# Patient Record
Sex: Female | Born: 1995
Health system: Southern US, Community
[De-identification: ages and names within clinical notes are randomized; demographics above are authoritative.]

## PROBLEM LIST (undated history)

## (undated) ENCOUNTER — Emergency Department: Discharge status: Absent without leave | Payer: Self-pay

## (undated) DIAGNOSIS — F429 Obsessive-compulsive disorder, unspecified: Secondary | ICD-10-CM

## (undated) DIAGNOSIS — F32A Depression, unspecified: Secondary | ICD-10-CM

## (undated) DIAGNOSIS — F419 Anxiety disorder, unspecified: Secondary | ICD-10-CM

## (undated) DIAGNOSIS — F329 Major depressive disorder, single episode, unspecified: Secondary | ICD-10-CM

## (undated) HISTORY — DX: Obsessive-compulsive disorder, unspecified: F42.9

## (undated) HISTORY — PX: ANKLE FRACTURE SURGERY: SHX122

## (undated) HISTORY — DX: Anxiety disorder, unspecified: F41.9

## (undated) HISTORY — PX: NO PAST SURGERIES: SHX2092

## (undated) HISTORY — DX: Depression, unspecified: F32.A

---
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
## 2015-09-22 ENCOUNTER — Emergency Department
Admission: EM | Admit: 2015-09-22 | Discharge: 2015-09-22 | Disposition: A | Discharge status: Home / Self Care | Payer: Self-pay | Source: Home / Self Care | Attending: Family Medicine | Admitting: Family Medicine

## 2015-09-22 DIAGNOSIS — R52 Pain, unspecified: Secondary | ICD-10-CM

## 2015-09-22 DIAGNOSIS — J069 Acute upper respiratory infection, unspecified: Secondary | ICD-10-CM

## 2015-09-22 LAB — POCT INFLUENZA A/B
Influenza A, POC: NEGATIVE
Influenza B, POC: NEGATIVE

## 2015-09-22 MED ORDER — BENZONATATE 100 MG PO CAPS: 100.0000 mg | ORAL_CAPSULE | Freq: Three times a day (TID) | ORAL | Status: DC

## 2015-09-22 MED ORDER — AZITHROMYCIN 250 MG PO TABS: 250.0000 mg | ORAL_TABLET | Freq: Every day | ORAL | Status: DC

## 2015-09-22 NOTE — Discharge Instructions (Signed)
You may take 400-600mg Ibuprofen (Motrin) every 6-8 hours for fever and pain  °Alternate with Tylenol  °You may take 500mg Tylenol every 4-6 hours as needed for fever and pain  °Follow-up with your primary care provider next week for recheck of symptoms if not improving.  °Be sure to drink plenty of fluids and rest, at least 8hrs of sleep a night, preferably more while you are sick. °Return urgent care or go to closest ER if you cannot keep down fluids/signs of dehydration, fever not reducing with Tylenol, difficulty breathing/wheezing, stiff neck, worsening condition, or other concerns (see below)  °Please take antibiotics as prescribed and be sure to complete entire course even if you start to feel better to ensure infection does not come back. ° ° °Cool Mist Vaporizers °Vaporizers may help relieve the symptoms of a cough and cold. They add moisture to the air, which helps mucus to become thinner and less sticky. This makes it easier to breathe and cough up secretions. Cool mist vaporizers do not cause serious burns like hot mist vaporizers, which may also be called steamers or humidifiers. Vaporizers have not been proven to help with colds. You should not use a vaporizer if you are allergic to mold. °HOME CARE INSTRUCTIONS °· Follow the package instructions for the vaporizer. °· Do not use anything other than distilled water in the vaporizer. °· Do not run the vaporizer all of the time. This can cause mold or bacteria to grow in the vaporizer. °· Clean the vaporizer after each time it is used. °· Clean and dry the vaporizer well before storing it. °· Stop using the vaporizer if worsening respiratory symptoms develop. °  °This information is not intended to replace advice given to you by your health care provider. Make sure you discuss any questions you have with your health care provider. °  °Document Released: 04/09/2004 Document Revised: 07/18/2013 Document Reviewed: 11/30/2012 °Elsevier Interactive Patient  Education ©2016 [employer] ° °

## 2015-09-22 NOTE — ED Notes (Signed)
Had a cold about a week ago, yesterday started with nausea, vomited, and generalized body aches.

## 2015-09-22 NOTE — ED Provider Notes (Signed)
CSN: [id number]     Arrival date & time 09/22/15  1424 History   First MD Initiated Contact with Patient 09/22/15 1447     Chief Complaint  Patient presents with  . Generalized Body Aches   (Consider location/radiation/quality/duration/timing/severity/associated sxs/prior Treatment) HPI The pt is a 19yo female presenting to [facility] with c/o 1 day of flu-like symptoms with body aches, nausea, one episode of vomiting, that was preceded by about 1 week of URI symptoms with cough and congestion. She still has a mild to moderately intermittent productive cough.    History reviewed. No pertinent past medical history. History reviewed. No pertinent past surgical history. Family History  Problem Relation Age of Onset  . Family history unknown: Yes   Social History  Substance Use Topics  . Smoking status: Never Smoker   . Smokeless tobacco: Never Used  . Alcohol Use: No   OB History    No data available     Review of Systems  Constitutional: Positive for fever and chills.  HENT: Positive for congestion, rhinorrhea, sneezing and sore throat. Negative for ear pain, sinus pressure, trouble swallowing and voice change.   Respiratory: Positive for cough. Negative for shortness of breath.   Cardiovascular: Negative for chest pain and palpitations.  Gastrointestinal: Positive for nausea, vomiting and abdominal pain (generalized). Negative for diarrhea.  Musculoskeletal: Negative for myalgias, back pain and arthralgias.  Skin: Negative for rash.  Neurological: Positive for headaches. Negative for dizziness and light-headedness.    Allergies  Review of patient's allergies indicates no known allergies.  Home Medications   Prior to Admission medications   Medication Sig Start Date End Date Taking? Authorizing Provider  azithromycin (ZITHROMAX) 250 MG tablet Take 2 tabs PO x 1 dose, then 1 tab PO QD x 4 days 03/24/13   Floydene Flock, MD  azithromycin (ZITHROMAX) 250 MG tablet Take 1 tablet (250  mg total) by mouth daily. Take first 2 tablets together, then 1 every day until finished. 09/22/15   Junius Finner, PA-C  benzonatate (TESSALON) 100 MG capsule Take 1-2 capsules (100-200 mg total) by mouth every 8 (eight) hours. 09/22/15   Junius Finner, PA-C  norethindrone-ethinyl estradiol-iron (ESTROSTEP FE,TILIA FE,TRI-LEGEST FE) 1-20/1-30/1-35 MG-MCG tablet Take 1 tablet by mouth daily.    Historical Provider, MD   Meds Ordered and Administered this Visit  Medications - No data to display  BP 101/72 mmHg  Pulse 111  Temp(Src) 98.2 F (36.8 C) (Oral)  Ht  (1.753 m)  Wt 145 lb (65.772 kg)  BMI 21.40 kg/m2  SpO2 100%  LMP 09/22/2015 No data found.   Physical Exam  Constitutional: She appears well-developed and well-nourished. No distress.  HENT:  Head: Normocephalic and atraumatic.  Right Ear: Tympanic membrane normal.  Left Ear: Tympanic membrane normal.  Nose: Mucosal edema present. Right sinus exhibits maxillary sinus tenderness and frontal sinus tenderness. Left sinus exhibits maxillary sinus tenderness. Left sinus exhibits no frontal sinus tenderness.  Mouth/Throat: Uvula is midline and mucous membranes are normal. Posterior oropharyngeal erythema present. No oropharyngeal exudate, posterior oropharyngeal edema or tonsillar abscesses.  Eyes: Conjunctivae are normal. No scleral icterus.  Neck: Normal range of motion. Neck supple.  Cardiovascular: Normal rate, regular rhythm and normal heart sounds.   Pulmonary/Chest: Effort normal and breath sounds normal. No respiratory distress. She has no wheezes. She has no rales. She exhibits no tenderness.  Abdominal: Soft. She exhibits no distension. There is no tenderness.  Musculoskeletal: Normal range of motion.  Neurological: She  is alert.  Skin: Skin is warm and dry. She is not diaphoretic.  Nursing note and vitals reviewed.   ED Course  Procedures (including critical care time)  Labs Review Labs Reviewed  POCT  INFLUENZA A/B    Imaging Review No results found.    MDM   1. Acute upper respiratory infection   2. Body aches     Pt c/o flu-like symptoms for 1 day but over 1 week with cough and congestion. Will tx for atypical bacteria with Azithromycin   Rx: azithromycin and tessalon  Advised pt to use acetaminophen and ibuprofen as needed for fever and pain. Encouraged rest and fluids. F/u with PCP in 7-10 days if not improving, sooner if worsening. Pt verbalized understanding and agreement with tx plan.    Junius Finner, PA-C 09/22/15 1606

## 2018-03-01 ENCOUNTER — Encounter: Payer: Self-pay | Admitting: Certified Nurse Midwife

## 2018-03-01 ENCOUNTER — Ambulatory Visit: Payer: Self-pay | Admitting: Certified Nurse Midwife

## 2018-03-01 ENCOUNTER — Telehealth: Payer: Self-pay

## 2018-03-01 ENCOUNTER — Telehealth: Payer: Self-pay | Admitting: Certified Nurse Midwife

## 2018-03-01 VITALS — BP 109/41 | HR 84 | Ht 69.0 in | Wt 162.2 lb

## 2018-03-01 DIAGNOSIS — N941 Unspecified dyspareunia: Secondary | ICD-10-CM

## 2018-03-01 DIAGNOSIS — N760 Acute vaginitis: Secondary | ICD-10-CM

## 2018-03-01 DIAGNOSIS — N9489 Other specified conditions associated with female genital organs and menstrual cycle: Secondary | ICD-10-CM

## 2018-03-01 DIAGNOSIS — N949 Unspecified condition associated with female genital organs and menstrual cycle: Secondary | ICD-10-CM

## 2018-03-01 MED ORDER — METRONIDAZOLE 500 MG PO TABS: 500.0000 mg | ORAL_TABLET | Freq: Two times a day (BID) | ORAL | 0 refills | Status: AC

## 2018-03-01 NOTE — Addendum Note (Signed)
Addended by: Brooke DareSICK, Cheick Suhr L on: 03/01/2018 04:58 PM   Modules accepted: Orders

## 2018-03-01 NOTE — Progress Notes (Signed)
New pt is here with c/o vaginal discomfort -itching and burning. 2 new partners in a month.

## 2018-03-01 NOTE — Progress Notes (Signed)
GYN ENCOUNTER NOTE  Subjective:       Brianna Peterson is a 22 y.o. No obstetric history on file. female is here for gynecologic evaluation of the following issues, has vaginal burning and itching that started about a month ago, that "comes and goes",but admits to having these same symptoms a month ago. She has two new partners and denies using condoms and complains of painful sex She denies abnormal discharge and foul odor.. She had STI testing about a couple of months ago at the health department before the new partners and was negative for STI.  She has tried OTC diflucan with some relief but symptoms have returned  Gynecologic History No LMP recorded. Contraception: OCP (estrogen/progesterone) Last Pap: recently per patient.. Results were: normal Last mammogram: N/A  Obstetric History OB History  No data available    No past medical history on file.  No past surgical history on file.  Current Outpatient Medications on File Prior to Visit  Medication Sig Dispense Refill  . azithromycin (ZITHROMAX) 250 MG tablet Take 2 tabs PO x 1 dose, then 1 tab PO QD x 4 days 6 tablet 0  . azithromycin (ZITHROMAX) 250 MG tablet Take 1 tablet (250 mg total) by mouth daily. Take first 2 tablets together, then 1 every day until finished. 6 tablet 0  . benzonatate (TESSALON) 100 MG capsule Take 1-2 capsules (100-200 mg total) by mouth every 8 (eight) hours. 21 capsule 0  . norethindrone-ethinyl estradiol-iron (ESTROSTEP FE,TILIA FE,TRI-LEGEST FE) 1-20/1-30/1-35 MG-MCG tablet Take 1 tablet by mouth daily.     No current facility-administered medications on file prior to visit.     No Known Allergies  Social History   Socioeconomic History  . Marital status: Single    Spouse name: Not on file  . Number of children: Not on file  . Years of education: Not on file  . Highest education level: Not on file  Occupational History  . Not on file  Social Needs  . Financial resource strain: Not on file   . Food insecurity:    Worry: Not on file    Inability: Not on file  . Transportation needs:    Medical: Not on file    Non-medical: Not on file  Tobacco Use  . Smoking status: Never Smoker  . Smokeless tobacco: Never Used  Substance and Sexual Activity  . Alcohol use: No  . Drug use: No  . Sexual activity: Yes    Birth control/protection: Pill  Lifestyle  . Physical activity:    Days per week: Not on file    Minutes per session: Not on file  . Stress: Not on file  Relationships  . Social connections:    Talks on phone: Not on file    Gets together: Not on file    Attends religious service: Not on file    Active member of club or organization: Not on file    Attends meetings of clubs or organizations: Not on file    Relationship status: Not on file  . Intimate partner violence:    Fear of current or ex partner: Not on file    Emotionally abused: Not on file    Physically abused: Not on file    Forced sexual activity: Not on file  Other Topics Concern  . Not on file  Social History Narrative  . Not on file    Family History  Family history unknown: Yes    The following portions of the patient's  history were reviewed and updated as appropriate: allergies, current medications, past family history, past medical history, past social history, past surgical history and problem list.  Review of Systems Review of Systems - Negative except as mentioned in HPI Review of Systems - General ROS: negative for - chills, fatigue, fever, hot flashes, malaise or night sweats Hematological and Lymphatic ROS: negative for - bleeding problems or swollen lymph nodes Gastrointestinal ROS: negative for - abdominal pain, blood in stools, change in bowel habits and nausea/vomiting Musculoskeletal ROS: negative for - joint pain, muscle pain or muscular weakness Genito-Urinary ROS: negative for - change in menstrual cycle, dysmenorrhea,, dysuria, genital discharge, genital ulcers, hematuria,  incontinence, irregular/heavy menses, nocturia or pelvic pain Positive-dysparenia, vaginal itching and burning  Objective:   There were no vitals taken for this visit. CONSTITUTIONAL: Well-developed, well-nourished female in no acute distress.  HENT:  Normocephalic, atraumatic.  NECK: Normal range of motion, supple, no masses.  Normal thyroid.  SKIN: Skin is warm and dry. No rash noted. Not diaphoretic. No erythema. No pallor. NEUROLGIC: Alert and oriented to person, place, and time. PSYCHIATRIC: Normal mood and affect. Normal behavior. Normal judgment and thought content. CARDIOVASCULAR:Not Examined RESPIRATORY: Not Examined BREASTS: Not Examined ABDOMEN: Soft, non distended; nontender  No Organomegaly. PELVIC:  External Genitalia: Normal  BUS: Normal  Vagina: Normal-Vaginal wall tender to touch  Cervix: Normal, small amount of bleeding noted due to start of menses, no CVA tenderness  Uterus: Normal size, shape,consistency, mobile  Adnexa: Normal  RV: Normal   Bladder: Nontender MUSCULOSKELETAL: Normal range of motion. No tenderness.  No cyanosis, clubbing, or edema.     Assessment:  Vaginitis Dyspareunia- discussed use of lubrication. Pt states that sometimes she gets dry and experiences friction with thrusting.   Wet mount- + Clue cells,-Whiff test- Flagyl prescribed. Advised safe sex practices Patient has no insurance. Will call for cost on testing for GC/chlamydia & trich. Will call with pricing for approval from pt to send test. If not will have done a health department.    AShanika Creay,SNM / Doreene BurkeAnnie Alanta Scobey, CNM    Plan:

## 2018-03-01 NOTE — Telephone Encounter (Signed)
The patient called and stated that she is going to need a call back I regards to her not being able to reach anyone from [facility] from the number that was given to her and that she is unsure if someone will get the vm she left. Please advise.

## 2018-03-01 NOTE — Patient Instructions (Signed)

## 2018-03-01 NOTE — Telephone Encounter (Signed)
Pt has no insurance at this point. A call was placed to Amy at [facility] inquiring about price per pt request. Amy states it will be approx $270 but [facility] will work with pt to reduce amount.Amy requested I send the specimen to lab. Amy asked me to have pt call the financial assistance Program at [phone]. A call was placed to pt and she was given the above information. Phone number and test code [id number] was written down by pt and she stated she will call. I asked pt if it was ok to send specimen and she agreed. [facility] to [facility].

## 2018-03-01 NOTE — Addendum Note (Signed)
Addended by: Brooke DareSICK, Cleburn Maiolo L on: 03/01/2018 05:03 PM   Modules accepted: Orders

## 2018-03-02 ENCOUNTER — Other Ambulatory Visit (HOSPITAL_COMMUNITY)
Admission: RE | Admit: 2018-03-02 | Discharge: 2018-03-02 | Disposition: A | Discharge status: Home / Self Care | Payer: Self-pay | Source: Ambulatory Visit | Attending: Certified Nurse Midwife | Admitting: Certified Nurse Midwife

## 2018-03-02 DIAGNOSIS — N760 Acute vaginitis: Secondary | ICD-10-CM | POA: Insufficient documentation

## 2018-03-02 DIAGNOSIS — N941 Unspecified dyspareunia: Secondary | ICD-10-CM | POA: Insufficient documentation

## 2018-03-02 NOTE — Addendum Note (Signed)
Addended by: Brooke DareSICK, Estrellita Lasky L on: 03/02/2018 09:26 AM   Modules accepted: Orders

## 2018-03-02 NOTE — Addendum Note (Signed)
Addended by: Brooke DareSICK, Kalisa Girtman L on: 03/02/2018 08:57 AM   Modules accepted: Orders

## 2018-03-03 LAB — CERVICOVAGINAL ANCILLARY ONLY
Chlamydia: NEGATIVE
Neisseria Gonorrhea: NEGATIVE
TRICH (WINDOWPATH): NEGATIVE

## 2018-05-02 ENCOUNTER — Other Ambulatory Visit (HOSPITAL_COMMUNITY)
Admission: RE | Admit: 2018-05-02 | Discharge: 2018-05-02 | Disposition: A | Discharge status: Home / Self Care | Payer: 59 | Source: Ambulatory Visit | Attending: Certified Nurse Midwife | Admitting: Certified Nurse Midwife

## 2018-05-02 ENCOUNTER — Encounter: Payer: Self-pay | Admitting: Certified Nurse Midwife

## 2018-05-02 ENCOUNTER — Ambulatory Visit (INDEPENDENT_AMBULATORY_CARE_PROVIDER_SITE_OTHER): Payer: 59 | Admitting: Certified Nurse Midwife

## 2018-05-02 VITALS — BP 98/57 | HR 73 | Ht 69.0 in | Wt 162.9 lb

## 2018-05-02 DIAGNOSIS — N949 Unspecified condition associated with female genital organs and menstrual cycle: Secondary | ICD-10-CM | POA: Diagnosis not present

## 2018-05-02 MED ORDER — FLUCONAZOLE 150 MG PO TABS: 150.0000 mg | ORAL_TABLET | ORAL | 0 refills | Status: AC

## 2018-05-02 NOTE — Progress Notes (Signed)
GYN ENCOUNTER NOTE  Subjective:       Brianna Peterson is a 22 y.o. G0P0000 female is here for gynecologic evaluation of the following issues:  1. Vaginal burning, she has had for the past few months. She was here in August and treated for BV she states that the discharge and burning improved some but never really completely went away. She denies any new partners and has not been sexually active in a while.      Gynecologic History Patient's last menstrual period was 04/20/2018 (exact date). Contraception: OCP (estrogen/progesterone) Last Pap: this year. Results were: normal per pt.  Last mammogram: n/a.   Obstetric History OB History  Gravida Para Term Preterm AB Living  0 0 0 0 0 0  SAB TAB Ectopic Multiple Live Births  0 0 0 0 0    History reviewed. No pertinent past medical history.  History reviewed. No pertinent surgical history.  Current Outpatient Medications on File Prior to Visit  Medication Sig Dispense Refill  . TRI-SPRINTEC 0.18/0.215/0.25 MG-35 MCG tablet Take 1 tablet by mouth daily.  3   No current facility-administered medications on file prior to visit.     No Known Allergies  Social History   Socioeconomic History  . Marital status: Single    Spouse name: Not on file  . Number of children: Not on file  . Years of education: Not on file  . Highest education level: Not on file  Occupational History  . Not on file  Social Needs  . Financial resource strain: Not on file  . Food insecurity:    Worry: Not on file    Inability: Not on file  . Transportation needs:    Medical: Not on file    Non-medical: Not on file  Tobacco Use  . Smoking status: Never Smoker  . Smokeless tobacco: Never Used  Substance and Sexual Activity  . Alcohol use: No  . Drug use: No  . Sexual activity: Yes    Birth control/protection: Pill  Lifestyle  . Physical activity:    Days per week: Not on file    Minutes per session: Not on file  . Stress: Not on file   Relationships  . Social connections:    Talks on phone: Not on file    Gets together: Not on file    Attends religious service: Not on file    Active member of club or organization: Not on file    Attends meetings of clubs or organizations: Not on file    Relationship status: Not on file  . Intimate partner violence:    Fear of current or ex partner: Not on file    Emotionally abused: Not on file    Physically abused: Not on file    Forced sexual activity: Not on file  Other Topics Concern  . Not on file  Social History Narrative  . Not on file    Family History  Problem Relation Age of Onset  . Rheum arthritis Mother     The following portions of the patient's history were reviewed and updated as appropriate: allergies, current medications, past family history, past medical history, past social history, past surgical history and problem list.  Review of Systems Review of Systems - Negative except as mentioned in HPI Review of Systems - General ROS: negative for - chills, fatigue, fever, hot flashes, malaise or night sweats Hematological and Lymphatic ROS: negative for - bleeding problems or swollen lymph nodes Gastrointestinal ROS: negative  for - abdominal pain, blood in stools, change in bowel habits and nausea/vomiting Musculoskeletal ROS: negative for - joint pain, muscle pain or muscular weakness Genito-Urinary ROS: negative for - change in menstrual cycle, dysmenorrhea, dyspareunia, dysuria, genital discharge, genital ulcers, hematuria, incontinence, irregular/heavy menses, nocturia or pelvic pain. Positive for vaginal burning.   Objective:   BP (!) 98/57   Pulse 73   Ht 5\' 9"  (1.753 m)   Wt 162 lb 14.4 oz (73.9 kg)   LMP 04/20/2018 (Exact Date)   BMI 24.06 kg/m  CONSTITUTIONAL: Well-developed, well-nourished female in no acute distress.  HENT:  Normocephalic, atraumatic.  NECK: Normal range of motion, supple, no masses.  Normal thyroid.  SKIN: Skin is warm and  dry. No rash noted. Not diaphoretic. No erythema. No pallor. NEUROLGIC: Alert and oriented to person, place, and time. PSYCHIATRIC: Normal mood and affect. Normal behavior. Normal judgment and thought content. CARDIOVASCULAR:Not Examined RESPIRATORY: Not Examined BREASTS: Not Examined ABDOMEN: Soft, non distended; Non tender.  No Organomegaly. PELVIC:  External Genitalia: Normal  BUS: Normal  Vagina: mild redness noted, burning discomfort at the introitus and on vaginal walls with opening of speculum  Cervix: Normal, ectropion present. Small bleeding with contact from seculum. Negative CMT.   Uterus: Normal size, shape,consistency, mobile  Adnexa: Normal MUSCULOSKELETAL: Normal range of motion. No tenderness.  No cyanosis, clubbing, or edema.   Assessment:   Vaginitis   Plan:  Yeast and BV panel completed today. Discussed use of Rephresh. Diflucan ordered 150 mg every 3 days x  3 doses then 1 a week for 3 months. Will follow up with results fo swab. Pt agrees to plan.   Doreene Burke, CNM  Doreene Burke, CNM

## 2018-05-02 NOTE — Patient Instructions (Signed)

## 2018-05-04 LAB — CERVICOVAGINAL ANCILLARY ONLY
BACTERIAL VAGINITIS: NEGATIVE
CANDIDA VAGINITIS: NEGATIVE

## 2018-06-21 DIAGNOSIS — F411 Generalized anxiety disorder: Secondary | ICD-10-CM | POA: Diagnosis not present

## 2018-07-05 DIAGNOSIS — F411 Generalized anxiety disorder: Secondary | ICD-10-CM | POA: Diagnosis not present

## 2018-07-15 DIAGNOSIS — F411 Generalized anxiety disorder: Secondary | ICD-10-CM | POA: Diagnosis not present

## 2018-09-12 DIAGNOSIS — M9906 Segmental and somatic dysfunction of lower extremity: Secondary | ICD-10-CM | POA: Diagnosis not present

## 2018-09-12 DIAGNOSIS — M9903 Segmental and somatic dysfunction of lumbar region: Secondary | ICD-10-CM | POA: Diagnosis not present

## 2018-09-12 DIAGNOSIS — M545 Low back pain: Secondary | ICD-10-CM | POA: Diagnosis not present

## 2018-09-12 DIAGNOSIS — M9904 Segmental and somatic dysfunction of sacral region: Secondary | ICD-10-CM | POA: Diagnosis not present

## 2018-09-12 DIAGNOSIS — M9902 Segmental and somatic dysfunction of thoracic region: Secondary | ICD-10-CM | POA: Diagnosis not present

## 2018-09-12 DIAGNOSIS — M9907 Segmental and somatic dysfunction of upper extremity: Secondary | ICD-10-CM | POA: Diagnosis not present

## 2018-09-12 DIAGNOSIS — M4125 Other idiopathic scoliosis, thoracolumbar region: Secondary | ICD-10-CM | POA: Diagnosis not present

## 2018-09-14 ENCOUNTER — Other Ambulatory Visit: Payer: Self-pay | Admitting: General Practice

## 2018-09-14 ENCOUNTER — Ambulatory Visit
Admission: RE | Admit: 2018-09-14 | Discharge: 2018-09-14 | Disposition: A | Discharge status: Home / Self Care | Payer: 59 | Source: Ambulatory Visit | Attending: General Practice | Admitting: General Practice

## 2018-09-14 DIAGNOSIS — M4125 Other idiopathic scoliosis, thoracolumbar region: Secondary | ICD-10-CM | POA: Insufficient documentation

## 2018-09-14 DIAGNOSIS — M545 Low back pain, unspecified: Secondary | ICD-10-CM

## 2018-09-14 DIAGNOSIS — M4186 Other forms of scoliosis, lumbar region: Secondary | ICD-10-CM | POA: Diagnosis not present

## 2018-09-14 DIAGNOSIS — M546 Pain in thoracic spine: Secondary | ICD-10-CM | POA: Diagnosis not present

## 2018-09-22 DIAGNOSIS — M9902 Segmental and somatic dysfunction of thoracic region: Secondary | ICD-10-CM | POA: Diagnosis not present

## 2018-09-22 DIAGNOSIS — M4125 Other idiopathic scoliosis, thoracolumbar region: Secondary | ICD-10-CM | POA: Diagnosis not present

## 2018-09-22 DIAGNOSIS — M9904 Segmental and somatic dysfunction of sacral region: Secondary | ICD-10-CM | POA: Diagnosis not present

## 2018-09-22 DIAGNOSIS — M9906 Segmental and somatic dysfunction of lower extremity: Secondary | ICD-10-CM | POA: Diagnosis not present

## 2018-09-22 DIAGNOSIS — M545 Low back pain: Secondary | ICD-10-CM | POA: Diagnosis not present

## 2018-09-22 DIAGNOSIS — M9903 Segmental and somatic dysfunction of lumbar region: Secondary | ICD-10-CM | POA: Diagnosis not present

## 2018-09-22 DIAGNOSIS — M9907 Segmental and somatic dysfunction of upper extremity: Secondary | ICD-10-CM | POA: Diagnosis not present

## 2018-09-28 DIAGNOSIS — M9904 Segmental and somatic dysfunction of sacral region: Secondary | ICD-10-CM | POA: Diagnosis not present

## 2018-09-28 DIAGNOSIS — M9906 Segmental and somatic dysfunction of lower extremity: Secondary | ICD-10-CM | POA: Diagnosis not present

## 2018-09-28 DIAGNOSIS — M4125 Other idiopathic scoliosis, thoracolumbar region: Secondary | ICD-10-CM | POA: Diagnosis not present

## 2018-09-28 DIAGNOSIS — M9903 Segmental and somatic dysfunction of lumbar region: Secondary | ICD-10-CM | POA: Diagnosis not present

## 2018-09-28 DIAGNOSIS — M9902 Segmental and somatic dysfunction of thoracic region: Secondary | ICD-10-CM | POA: Diagnosis not present

## 2018-09-28 DIAGNOSIS — M9907 Segmental and somatic dysfunction of upper extremity: Secondary | ICD-10-CM | POA: Diagnosis not present

## 2018-09-28 DIAGNOSIS — M545 Low back pain: Secondary | ICD-10-CM | POA: Diagnosis not present

## 2018-10-10 DIAGNOSIS — F419 Anxiety disorder, unspecified: Secondary | ICD-10-CM | POA: Diagnosis not present

## 2019-01-25 ENCOUNTER — Telehealth: Payer: 59 | Admitting: Nurse Practitioner

## 2019-01-25 DIAGNOSIS — B373 Candidiasis of vulva and vagina: Secondary | ICD-10-CM | POA: Diagnosis not present

## 2019-01-25 DIAGNOSIS — B3731 Acute candidiasis of vulva and vagina: Secondary | ICD-10-CM

## 2019-01-25 MED ORDER — FLUCONAZOLE 150 MG PO TABS: 150.0000 mg | ORAL_TABLET | Freq: Once | ORAL | 0 refills | Status: AC

## 2019-01-25 NOTE — Progress Notes (Signed)

## 2019-03-10 DIAGNOSIS — F3289 Other specified depressive episodes: Secondary | ICD-10-CM | POA: Diagnosis not present

## 2019-03-10 DIAGNOSIS — F411 Generalized anxiety disorder: Secondary | ICD-10-CM | POA: Diagnosis not present

## 2019-03-17 DIAGNOSIS — F3289 Other specified depressive episodes: Secondary | ICD-10-CM | POA: Diagnosis not present

## 2019-03-17 DIAGNOSIS — F411 Generalized anxiety disorder: Secondary | ICD-10-CM | POA: Diagnosis not present

## 2019-03-21 DIAGNOSIS — S86811A Strain of other muscle(s) and tendon(s) at lower leg level, right leg, initial encounter: Secondary | ICD-10-CM | POA: Diagnosis not present

## 2019-03-24 DIAGNOSIS — F411 Generalized anxiety disorder: Secondary | ICD-10-CM | POA: Diagnosis not present

## 2019-03-24 DIAGNOSIS — F3289 Other specified depressive episodes: Secondary | ICD-10-CM | POA: Diagnosis not present

## 2019-03-27 DIAGNOSIS — F419 Anxiety disorder, unspecified: Secondary | ICD-10-CM | POA: Diagnosis not present

## 2019-03-30 DIAGNOSIS — S93491S Sprain of other ligament of right ankle, sequela: Secondary | ICD-10-CM | POA: Diagnosis not present

## 2019-03-30 DIAGNOSIS — S92141S Displaced dome fracture of right talus, sequela: Secondary | ICD-10-CM | POA: Diagnosis not present

## 2019-03-30 DIAGNOSIS — Z01818 Encounter for other preprocedural examination: Secondary | ICD-10-CM | POA: Diagnosis not present

## 2019-03-30 DIAGNOSIS — M93271 Osteochondritis dissecans, right ankle and joints of right foot: Secondary | ICD-10-CM | POA: Diagnosis not present

## 2019-03-30 DIAGNOSIS — S92141D Displaced dome fracture of right talus, subsequent encounter for fracture with routine healing: Secondary | ICD-10-CM | POA: Diagnosis not present

## 2019-03-30 DIAGNOSIS — S93491D Sprain of other ligament of right ankle, subsequent encounter: Secondary | ICD-10-CM | POA: Diagnosis not present

## 2019-04-05 DIAGNOSIS — F411 Generalized anxiety disorder: Secondary | ICD-10-CM | POA: Diagnosis not present

## 2019-04-05 DIAGNOSIS — F3289 Other specified depressive episodes: Secondary | ICD-10-CM | POA: Diagnosis not present

## 2019-04-10 DIAGNOSIS — Z01812 Encounter for preprocedural laboratory examination: Secondary | ICD-10-CM | POA: Diagnosis not present

## 2019-04-10 DIAGNOSIS — Z20828 Contact with and (suspected) exposure to other viral communicable diseases: Secondary | ICD-10-CM | POA: Diagnosis not present

## 2019-04-12 DIAGNOSIS — M24071 Loose body in right ankle: Secondary | ICD-10-CM | POA: Diagnosis not present

## 2019-04-12 DIAGNOSIS — S93491A Sprain of other ligament of right ankle, initial encounter: Secondary | ICD-10-CM | POA: Diagnosis not present

## 2019-04-12 DIAGNOSIS — Z79899 Other long term (current) drug therapy: Secondary | ICD-10-CM | POA: Diagnosis not present

## 2019-04-12 DIAGNOSIS — S92141A Displaced dome fracture of right talus, initial encounter for closed fracture: Secondary | ICD-10-CM | POA: Diagnosis not present

## 2019-04-12 DIAGNOSIS — M25371 Other instability, right ankle: Secondary | ICD-10-CM | POA: Diagnosis not present

## 2019-04-12 DIAGNOSIS — G8918 Other acute postprocedural pain: Secondary | ICD-10-CM | POA: Diagnosis not present

## 2019-04-12 DIAGNOSIS — S93431A Sprain of tibiofibular ligament of right ankle, initial encounter: Secondary | ICD-10-CM | POA: Diagnosis not present

## 2019-04-12 DIAGNOSIS — T753XXA Motion sickness, initial encounter: Secondary | ICD-10-CM | POA: Diagnosis not present

## 2019-04-12 DIAGNOSIS — M93271 Osteochondritis dissecans, right ankle and joints of right foot: Secondary | ICD-10-CM | POA: Diagnosis not present

## 2019-04-12 DIAGNOSIS — Z791 Long term (current) use of non-steroidal anti-inflammatories (NSAID): Secondary | ICD-10-CM | POA: Diagnosis not present

## 2019-04-19 DIAGNOSIS — F411 Generalized anxiety disorder: Secondary | ICD-10-CM | POA: Diagnosis not present

## 2019-04-19 DIAGNOSIS — F3289 Other specified depressive episodes: Secondary | ICD-10-CM | POA: Diagnosis not present

## 2019-04-27 DIAGNOSIS — S92141D Displaced dome fracture of right talus, subsequent encounter for fracture with routine healing: Secondary | ICD-10-CM | POA: Diagnosis not present

## 2019-05-08 DIAGNOSIS — S92141D Displaced dome fracture of right talus, subsequent encounter for fracture with routine healing: Secondary | ICD-10-CM | POA: Diagnosis not present

## 2019-05-22 DIAGNOSIS — S92141D Displaced dome fracture of right talus, subsequent encounter for fracture with routine healing: Secondary | ICD-10-CM | POA: Diagnosis not present

## 2019-06-05 DIAGNOSIS — S92141D Displaced dome fracture of right talus, subsequent encounter for fracture with routine healing: Secondary | ICD-10-CM | POA: Diagnosis not present

## 2019-06-21 DIAGNOSIS — S92141D Displaced dome fracture of right talus, subsequent encounter for fracture with routine healing: Secondary | ICD-10-CM | POA: Diagnosis not present

## 2019-06-30 DIAGNOSIS — S92141D Displaced dome fracture of right talus, subsequent encounter for fracture with routine healing: Secondary | ICD-10-CM | POA: Diagnosis not present

## 2019-07-10 DIAGNOSIS — S93491D Sprain of other ligament of right ankle, subsequent encounter: Secondary | ICD-10-CM | POA: Diagnosis not present

## 2019-07-10 DIAGNOSIS — S93491S Sprain of other ligament of right ankle, sequela: Secondary | ICD-10-CM | POA: Diagnosis not present

## 2019-07-11 DIAGNOSIS — S92141D Displaced dome fracture of right talus, subsequent encounter for fracture with routine healing: Secondary | ICD-10-CM | POA: Diagnosis not present

## 2019-07-15 DIAGNOSIS — Z20828 Contact with and (suspected) exposure to other viral communicable diseases: Secondary | ICD-10-CM | POA: Diagnosis not present

## 2019-07-18 DIAGNOSIS — S92141D Displaced dome fracture of right talus, subsequent encounter for fracture with routine healing: Secondary | ICD-10-CM | POA: Diagnosis not present

## 2019-08-16 ENCOUNTER — Other Ambulatory Visit: Payer: Self-pay

## 2019-08-16 ENCOUNTER — Other Ambulatory Visit: Payer: Self-pay | Admitting: Medical-Surgical

## 2019-08-16 ENCOUNTER — Encounter: Payer: Self-pay | Admitting: Medical-Surgical

## 2019-08-16 ENCOUNTER — Ambulatory Visit (INDEPENDENT_AMBULATORY_CARE_PROVIDER_SITE_OTHER): Payer: No Typology Code available for payment source | Admitting: Medical-Surgical

## 2019-08-16 ENCOUNTER — Other Ambulatory Visit (HOSPITAL_COMMUNITY)
Admission: RE | Admit: 2019-08-16 | Discharge: 2019-08-16 | Disposition: A | Discharge status: Home / Self Care | Payer: No Typology Code available for payment source | Source: Ambulatory Visit | Attending: Medical-Surgical | Admitting: Medical-Surgical

## 2019-08-16 VITALS — BP 92/58 | HR 77 | Temp 98.1°F | Ht 68.0 in | Wt 149.0 lb

## 2019-08-16 DIAGNOSIS — F419 Anxiety disorder, unspecified: Secondary | ICD-10-CM | POA: Diagnosis not present

## 2019-08-16 DIAGNOSIS — Z113 Encounter for screening for infections with a predominantly sexual mode of transmission: Secondary | ICD-10-CM

## 2019-08-16 DIAGNOSIS — Z124 Encounter for screening for malignant neoplasm of cervix: Secondary | ICD-10-CM | POA: Insufficient documentation

## 2019-08-16 DIAGNOSIS — R6889 Other general symptoms and signs: Secondary | ICD-10-CM | POA: Diagnosis not present

## 2019-08-16 DIAGNOSIS — F429 Obsessive-compulsive disorder, unspecified: Secondary | ICD-10-CM

## 2019-08-16 DIAGNOSIS — Z7689 Persons encountering health services in other specified circumstances: Secondary | ICD-10-CM

## 2019-08-16 DIAGNOSIS — F329 Major depressive disorder, single episode, unspecified: Secondary | ICD-10-CM

## 2019-08-16 DIAGNOSIS — F32A Depression, unspecified: Secondary | ICD-10-CM

## 2019-08-16 DIAGNOSIS — B9689 Other specified bacterial agents as the cause of diseases classified elsewhere: Secondary | ICD-10-CM

## 2019-08-16 LAB — WET PREP FOR TRICH, YEAST, CLUE
MICRO NUMBER:: 10061295
Specimen Quality: ADEQUATE

## 2019-08-16 NOTE — Progress Notes (Signed)
New Patient Office Visit  Subjective:  Patient ID: Brianna Peterson, female    DOB: Jun 18, 1996  Age: 24 y.o. MRN: [id number]  CC:  Chief Complaint  Patient presents with  . Establish Care  . Gynecologic Exam    HPI Brianna Peterson is a 24 year old female presenting today to establish care.  She would like to have a Pap smear done today along with STI screening.  Last Pap smear age 72, normal.  Currently has 2+ female sexual partners, denies condom use.  Birth control-pullout method and calendar tracking.  Last STI testing in 2019.  Anxiety/depression/OCD: On Wellbutrin 150 mg twice daily.  Medication effective, wants to continue current regimen.  Reports increased hair loss and cold intolerance with periods of excessive sweating and hot flashes.  Rarely eats red meat and is concerned that she may be anemic or have some hormonal issues since stopping birth control.  Would like to have blood work drawn today.   Past Medical History:  Diagnosis Date  . Anxiety   . Depression   . Obsessive compulsive disorder     Past Surgical History:  Procedure Laterality Date  . ANKLE FRACTURE SURGERY Right   . NO PAST SURGERIES      Family History  Problem Relation Age of Onset  . Rheum arthritis Mother     Social History   Socioeconomic History  . Marital status: Single    Spouse name: Not on file  . Number of children: Not on file  . Years of education: Not on file  . Highest education level: Not on file  Occupational History  . Occupation: Ultras[employer]    Employer: [employer]  Tobacco Use  . Smoking status: Never Smoker  . Smokeless tobacco: Never Used  Substance and Sexual Activity  . Alcohol use: Yes    Comment: 5 drinks/week  . Drug use: No  . Sexual activity: Yes    Partners: Male    Birth control/protection: None  Other Topics Concern  . Not on file  Social History Narrative  . Not on file   Social Determinants of Health   Financial Resource Strain:   .  Difficulty of Paying Living Expenses: Not on file  Food Insecurity:   . Worried About Programme researcher, broadcasting/film/video in the Last Year: Not on file  . Ran Out of Food in the Last Year: Not on file  Transportation Needs:   . Lack of Transportation (Medical): Not on file  . Lack of Transportation (Non-Medical): Not on file  Physical Activity:   . Days of Exercise per Week: Not on file  . Minutes of Exercise per Session: Not on file  Stress:   . Feeling of Stress : Not on file  Social Connections:   . Frequency of Communication with Friends and Family: Not on file  . Frequency of Social Gatherings with Friends and Family: Not on file  . Attends Religious Services: Not on file  . Active Member of Clubs or Organizations: Not on file  . Attends Banker Meetings: Not on file  . Marital Status: Not on file  Intimate Partner Violence:   . Fear of Current or Ex-Partner: Not on file  . Emotionally Abused: Not on file  . Physically Abused: Not on file  . Sexually Abused: Not on file    ROS Review of Systems  Constitutional: Negative for chills and fever.  Respiratory: Negative for chest tightness and shortness of breath.   Cardiovascular: Negative for chest pain  and palpitations.  Endocrine: Positive for cold intolerance.  Genitourinary: Negative for dysuria, menstrual problem, pelvic pain, vaginal bleeding, vaginal discharge and vaginal pain.   Depression screen [facility] 2/9 08/16/2019  Decreased Interest 0  Down, Depressed, Hopeless 1  PHQ - 2 Score 1  Altered sleeping 3  Tired, decreased energy 2  Change in appetite 2  Feeling bad or failure about yourself  1  Trouble concentrating 0  Moving slowly or fidgety/restless 1  Suicidal thoughts 0  PHQ-9 Score 10  Difficult doing work/chores Not difficult at all   GAD 7 : Generalized Anxiety Score 08/16/2019  Nervous, Anxious, on Edge 3  Control/stop worrying 1  Worry too much - different things 1  Trouble relaxing 2  Restless 0   Easily annoyed or irritable 3  Afraid - awful might happen 2  Total GAD 7 Score 12  Anxiety Difficulty Not difficult at all     Objective:   Today's Vitals: BP (!) 92/58   Pulse 77   Temp 98.1 F (36.7 C) (Oral)   Ht 5\' 8"  (1.727 m)   Wt 149 lb 0.6 oz (67.6 kg)   LMP 08/06/2019   SpO2 100%   BMI 22.66 kg/m   Physical Exam Vitals reviewed. Exam conducted with a chaperone present.  Constitutional:      Appearance: Normal appearance.  HENT:     Head: Normocephalic and atraumatic.  Cardiovascular:     Rate and Rhythm: Normal rate and regular rhythm.     Pulses: Normal pulses.     Heart sounds: Normal heart sounds. No murmur. No friction rub. No gallop.   Pulmonary:     Effort: Pulmonary effort is normal. No respiratory distress.     Breath sounds: Normal breath sounds.  Genitourinary:    General: Normal vulva.     Exam position: Lithotomy position.     Pubic Area: No rash.      Labia:        Right: No rash or lesion.        Left: No rash or lesion.      Vagina: Normal.     Cervix: Discharge (Thick, yellow) and friability present.     Rectum: Normal.  Musculoskeletal:        General: Normal range of motion.  Skin:    General: Skin is warm and dry.  Neurological:     Mental Status: She is alert and oriented to person, place, and time.     Assessment & Plan:   Problem List Items Addressed This Visit      Other   Screening examination for sexually transmitted disease - Primary    Checking for gonorrhea, chlamydia, RPR, HIV, hepatitis B/C, and trichomonas.  Discussed HSV-2 testing recommendations in asymptomatic individuals.  Will defer this at this time.      Relevant Orders   Cytology - PAP   WET PREP FOR TRICH, YEAST, CLUE   Hepatitis B surface antigen   RPR   Hepatitis C antibody, reflex   Hepatitis B surface antibody,qualitative   Hepatitis B core antibody, total   Hepatitis C antibody   Cold intolerance    Checking CBC, CMP, and TSH.       Relevant Orders   CBC   COMPLETE METABOLIC PANEL WITH GFR   TSH   Screening for cervical cancer    Pap smear without HPV testing done today.      Relevant Orders   Cytology - PAP   Encounter to  establish care    Discussed birth control options.  Not interested in hormonal birth control at this time.  Not comfortable with the idea of IUDs or implants.  Emphasized the importance of using condoms to prevent pregnancy and STIs.      Anxiety and depression    Well managed with Wellbutrin.  Continue twice daily dosing.      Relevant Medications   buPROPion (WELLBUTRIN SR) 150 MG 12 hr tablet   Obsessive-compulsive disorder    Well managed by Wellbutrin twice daily.  Continue regimen.      Relevant Medications   buPROPion (WELLBUTRIN SR) 150 MG 12 hr tablet      Outpatient Encounter Medications as of 08/16/2019  Medication Sig  . buPROPion (WELLBUTRIN SR) 150 MG 12 hr tablet Take 150 mg by mouth 2 (two) times daily.  . norethindrone-ethinyl estradiol (NECON,BREVICON,MODICON) 0.5-35 MG-MCG tablet Take 1 tablet by mouth daily.   No facility-administered encounter medications on file as of 08/16/2019.    Follow-up: Return in about 1 year (around 08/15/2020) for anxiety, depression follow up or sooner if needed.   Samuel Bouche, NP

## 2019-08-16 NOTE — Assessment & Plan Note (Signed)
Well managed by Wellbutrin twice daily.  Continue regimen.

## 2019-08-16 NOTE — Assessment & Plan Note (Signed)
Well managed with Wellbutrin.  Continue twice daily dosing.

## 2019-08-16 NOTE — Assessment & Plan Note (Signed)
Checking for gonorrhea, chlamydia, RPR, HIV, hepatitis B/C, and trichomonas.  Discussed HSV-2 testing recommendations in asymptomatic individuals.  Will defer this at this time.

## 2019-08-16 NOTE — Assessment & Plan Note (Signed)
Checking CBC, CMP, and TSH.

## 2019-08-16 NOTE — Assessment & Plan Note (Signed)
Discussed birth control options.  Not interested in hormonal birth control at this time.  Not comfortable with the idea of IUDs or implants.  Emphasized the importance of using condoms to prevent pregnancy and STIs.

## 2019-08-16 NOTE — Assessment & Plan Note (Signed)
Pap smear without HPV testing done today.

## 2019-08-17 LAB — CBC
HCT: 41.9 % (ref 35.0–45.0)
Hemoglobin: 13.6 g/dL (ref 11.7–15.5)
MCH: 29.2 pg (ref 27.0–33.0)
MCHC: 32.5 g/dL (ref 32.0–36.0)
MCV: 90.1 fL (ref 80.0–100.0)
MPV: 9.9 fL (ref 7.5–12.5)
Platelets: 266 10*3/uL (ref 140–400)
RBC: 4.65 10*6/uL (ref 3.80–5.10)
RDW: 12.1 % (ref 11.0–15.0)
WBC: 6.2 10*3/uL (ref 3.8–10.8)

## 2019-08-17 LAB — HEPATITIS C ANTIBODY
Hepatitis C Ab: NONREACTIVE
SIGNAL TO CUT-OFF: 0.01 (ref <1.00)

## 2019-08-17 LAB — CYTOLOGY - PAP
Chlamydia: NEGATIVE
Comment: NEGATIVE
Comment: NORMAL
Diagnosis: NEGATIVE
Neisseria Gonorrhea: NEGATIVE

## 2019-08-17 LAB — COMPLETE METABOLIC PANEL WITH GFR
AG Ratio: 1.9 (calc) (ref 1.0–2.5)
ALT: 23 U/L (ref 6–29)
AST: 16 U/L (ref 10–30)
Albumin: 4.7 g/dL (ref 3.6–5.1)
Alkaline phosphatase (APISO): 61 U/L (ref 31–125)
BUN: 18 mg/dL (ref 7–25)
CO2: 28 mmol/L (ref 20–32)
Calcium: 9.8 mg/dL (ref 8.6–10.2)
Chloride: 104 mmol/L (ref 98–110)
Creat: 0.88 mg/dL (ref 0.50–1.10)
GFR, Est African American: 107 mL/min/{1.73_m2} (ref >=60)
GFR, Est Non African American: 92 mL/min/{1.73_m2} (ref >=60)
Globulin: 2.5 g/dL (calc) (ref 1.9–3.7)
Glucose, Bld: 85 mg/dL (ref 65–99)
Potassium: 4.4 mmol/L (ref 3.5–5.3)
Sodium: 140 mmol/L (ref 135–146)
Total Bilirubin: 1.3 mg/dL — ABNORMAL HIGH (ref 0.2–1.2)
Total Protein: 7.2 g/dL (ref 6.1–8.1)

## 2019-08-17 LAB — TSH: TSH: 1.13 mIU/L

## 2019-08-17 LAB — HIV ANTIBODY (ROUTINE TESTING W REFLEX): HIV 1&2 Ab, 4th Generation: NONREACTIVE

## 2019-08-17 LAB — HEPATITIS B CORE ANTIBODY, TOTAL: Hep B Core Total Ab: NONREACTIVE

## 2019-08-17 LAB — HEPATITIS B SURFACE ANTIBODY,QUALITATIVE: Hep B S Ab: REACTIVE — AB

## 2019-08-17 LAB — HEPATITIS B SURFACE ANTIGEN: Hepatitis B Surface Ag: NONREACTIVE

## 2019-08-17 LAB — RPR: RPR Ser Ql: NONREACTIVE

## 2019-08-17 MED ORDER — METRONIDAZOLE 500 MG PO TABS
500.0000 mg | ORAL_TABLET | Freq: Two times a day (BID) | ORAL | 0 refills | Status: AC
Start: 2019-08-17 — End: 2019-08-24

## 2019-08-17 NOTE — Addendum Note (Signed)
Addended byChristen Butter on: 08/17/2019 01:57 PM   Modules accepted: Orders

## 2019-08-18 ENCOUNTER — Telehealth: Payer: Self-pay

## 2019-08-18 NOTE — Telephone Encounter (Signed)
Pt viewed the portal message today at 10:39 AM.

## 2019-08-18 NOTE — Telephone Encounter (Signed)
I called and [facility] to share the information below from Christen Butter, DNP, APRN, FNP-BC. I also sent the pt a portal message and cc'd the note from Joy. Pt has viewed the portal message sent.

## 2019-08-18 NOTE — Telephone Encounter (Signed)
[facility]. (1st attempt). Joy wants to make sure that the pt see's her message written on 08/18/2019 on the portal message dated 08/16/2019. I also sent a separate portal message to the pt to be sure and track if she views it though MyChart.

## 2019-09-11 ENCOUNTER — Telehealth (INDEPENDENT_AMBULATORY_CARE_PROVIDER_SITE_OTHER): Payer: No Typology Code available for payment source

## 2019-09-11 DIAGNOSIS — S92109D Unspecified fracture of unspecified talus, subsequent encounter for fracture with routine healing: Secondary | ICD-10-CM

## 2019-09-11 NOTE — Telephone Encounter (Signed)
Pt is requesting a referral to Dr. Winfred Leeds at [facility]. She is established with Dr. Winfred Leeds and has been seeing him for a closed fracture of talus. She states that with her new insurance that a referral from her PCP is required. She states once the referral has been placed that she needs a copy of the referral as well.

## 2019-09-11 NOTE — Telephone Encounter (Signed)
Referral entered as requested.  

## 2019-09-12 NOTE — Telephone Encounter (Signed)
Pt aware that referral has been entered and faxed to [facility]. Pt requesting a copy of the referral for her records faxed to [phone]. Referral info faxed and a confirmation page has been received.

## 2019-09-24 IMAGING — CR DG LUMBAR SPINE 2-3V
3 series · 3 of 3 positions shown · non-contrast
Comparison: None

CLINICAL DATA: Mid and low back pain.

EXAM:
LUMBAR SPINE - 2-3 VIEW

[l-spine ap]
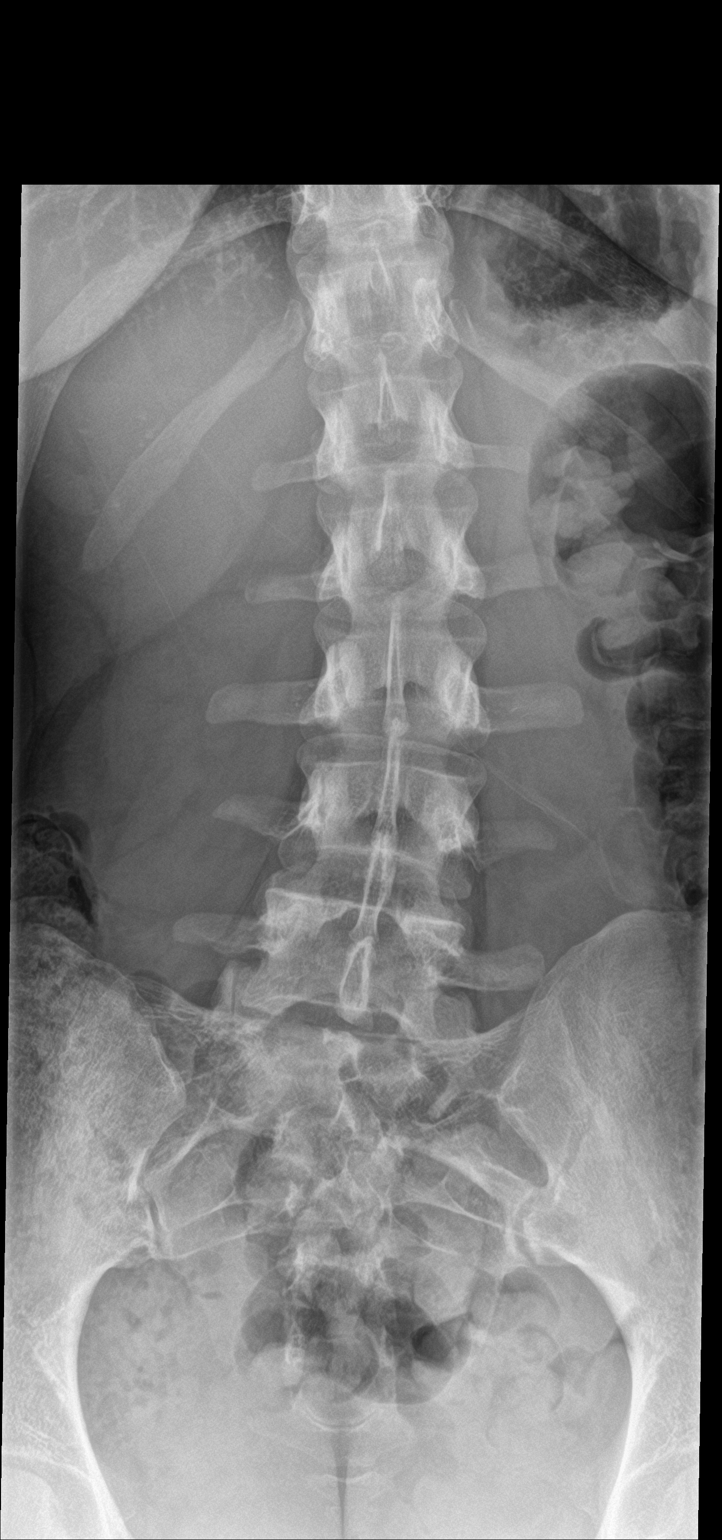

[l-spine lat]
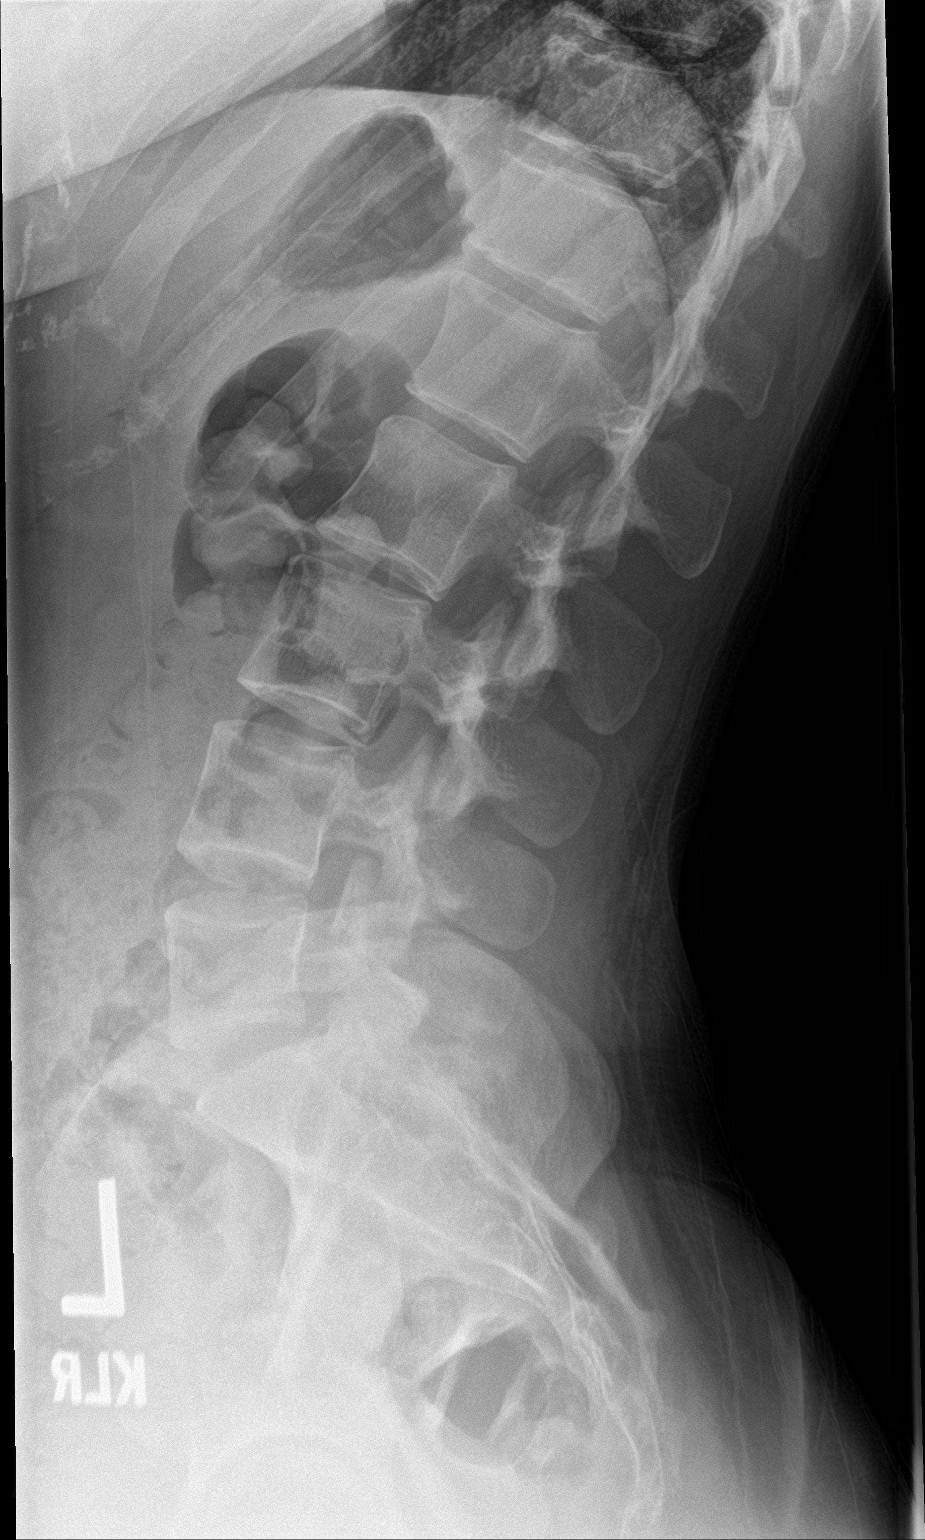

[l-spine spot]
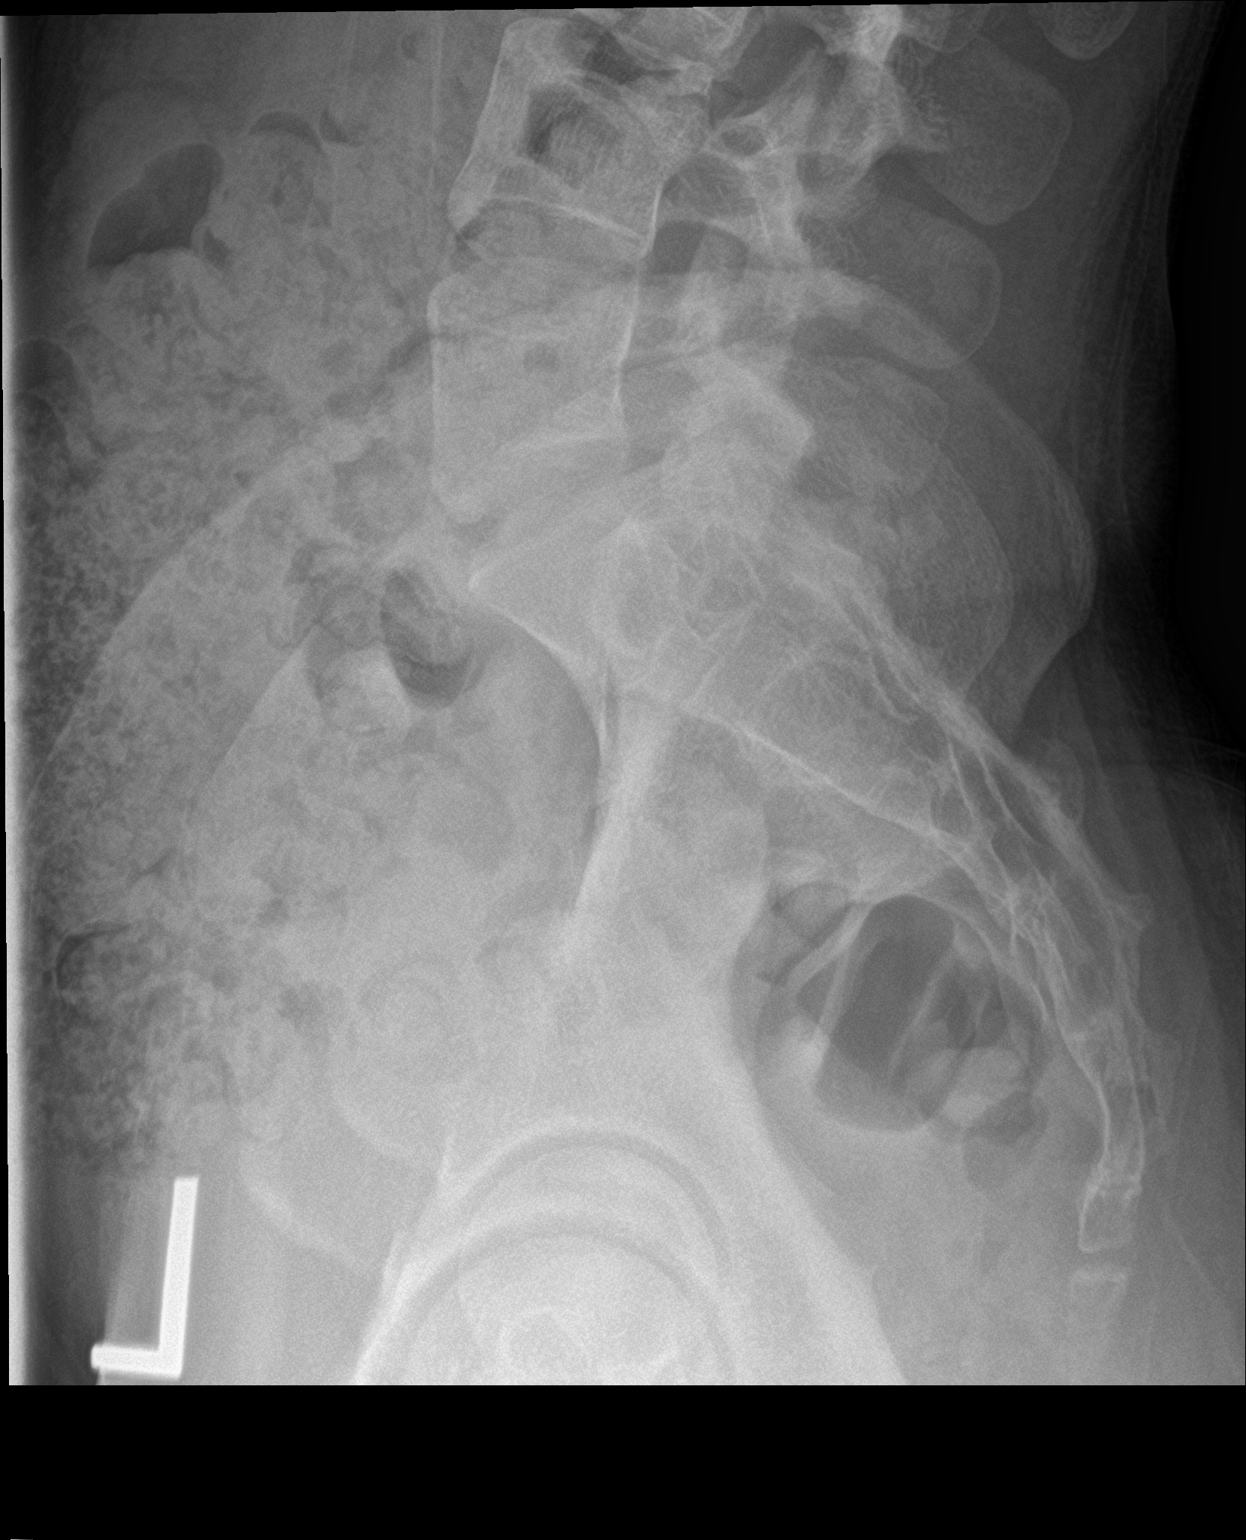

[3 of 3 positions shown; findings below may reference images not displayed]

FINDINGS: There is a 12 degree lumbar scoliosis with convexity to the left
centered at L3-4. The disc spaces are well preserved. Lateral
alignment is normal. No appreciable facet arthritis.
IMPRESSION: 12 degree lumbar scoliosis.  Otherwise, normal lumbar spine.

## 2019-09-24 IMAGING — CR DG THORACIC SPINE 2V
3 series · 3 of 3 positions shown · non-contrast
Comparison: None.

CLINICAL DATA: Mid and lower back pain with twisting movements.

EXAM:
THORACIC SPINE 2 VIEWS

[t-spine ap]
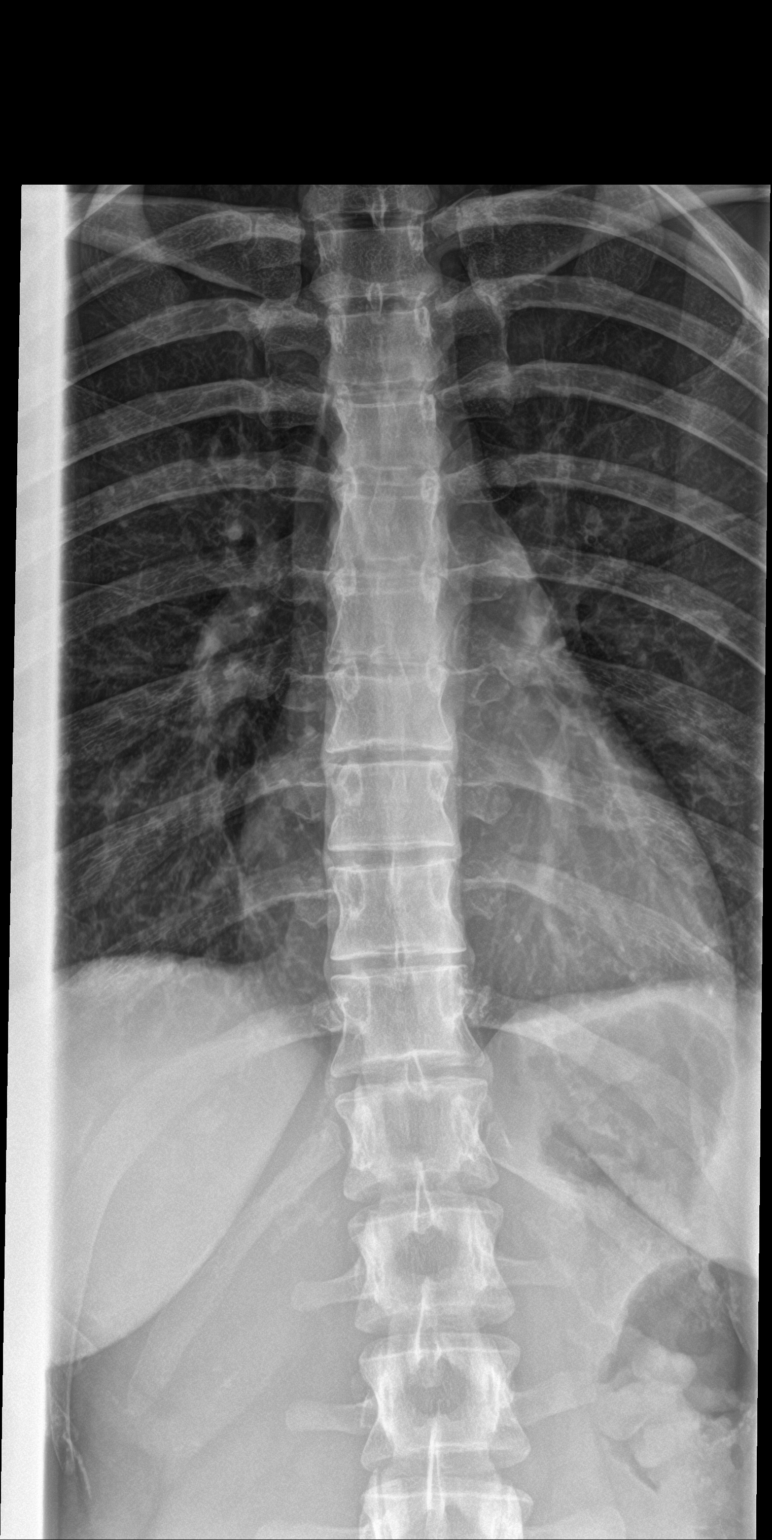

[t-spine lat]
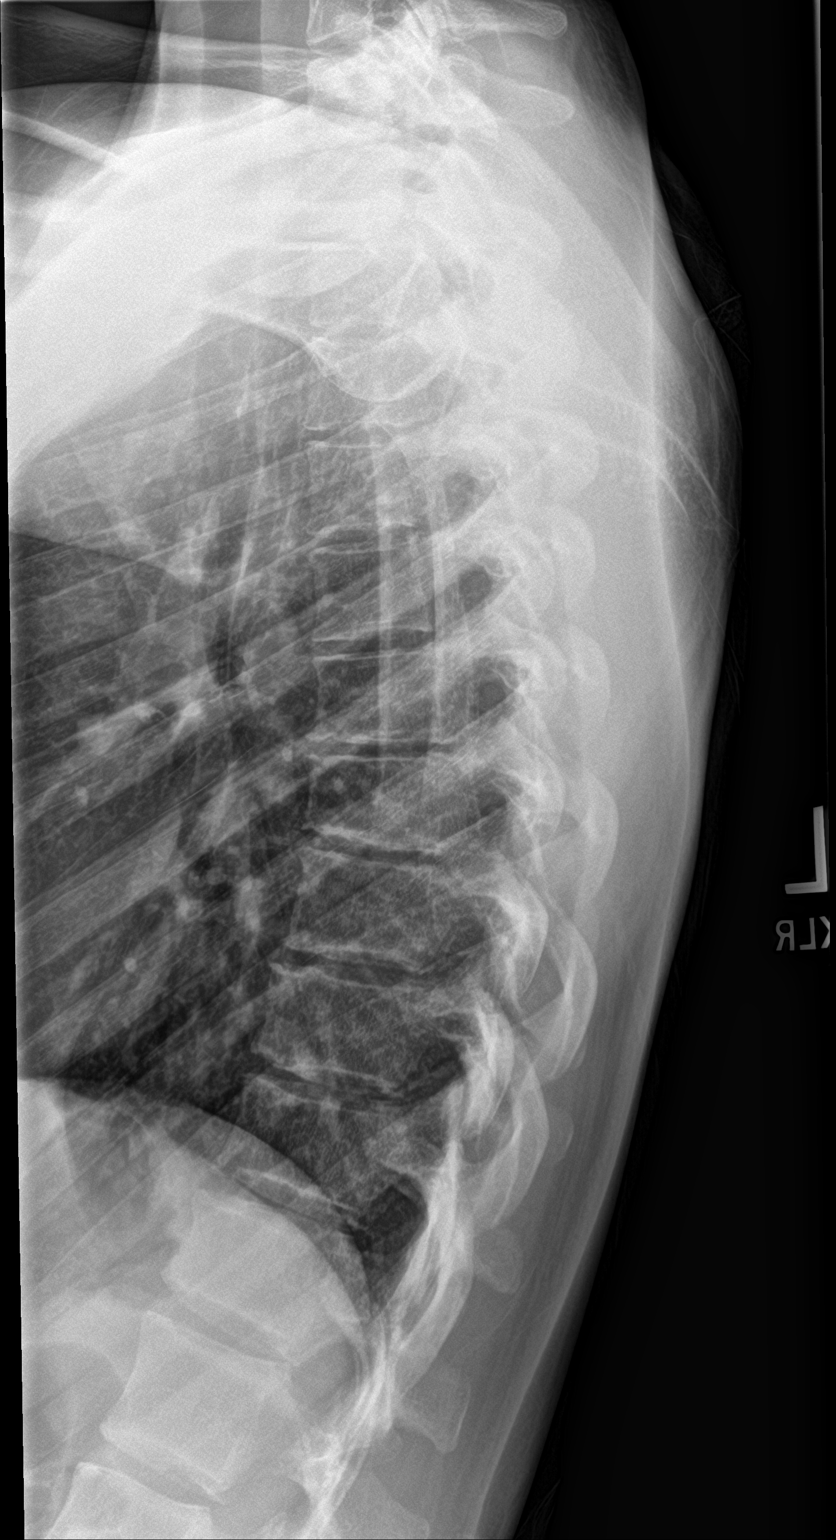

[t-spine swimmers]
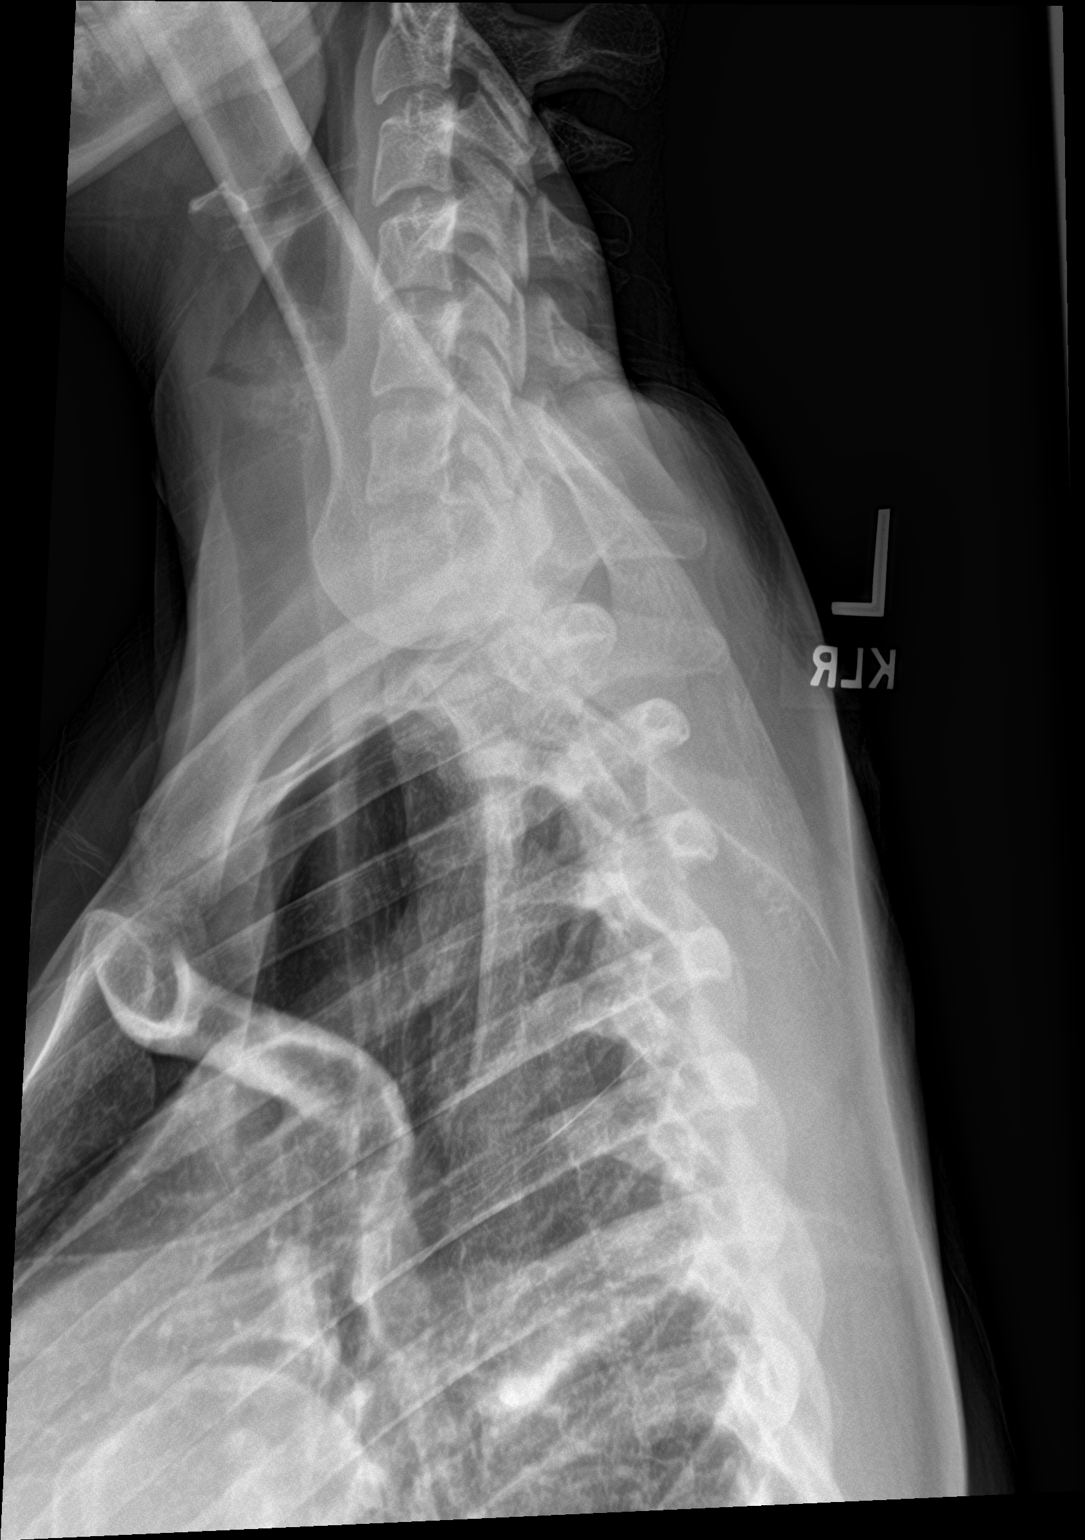

[3 of 3 positions shown; findings below may reference images not displayed]

FINDINGS: There is no evidence of thoracic spine fracture or bone destruction.
Alignment is essentially normal. Disc spaces are well preserved. No
other significant bone abnormalities are identified.
IMPRESSION: Negative thoracic spine.

## 2020-01-23 ENCOUNTER — Other Ambulatory Visit: Payer: Self-pay

## 2020-01-23 DIAGNOSIS — F32A Depression, unspecified: Secondary | ICD-10-CM

## 2020-01-23 MED ORDER — BUPROPION HCL ER (SR) 150 MG PO TB12: 150.0000 mg | ORAL_TABLET | Freq: Two times a day (BID) | ORAL | 0 refills | Status: AC

## 2020-01-23 NOTE — Telephone Encounter (Signed)
Brianna Peterson is requesting a refill on Wellbutrin. Historical provider.
